# Patient Record
Sex: Female | Born: 1970 | Race: Black or African American | Hispanic: No | Smoking: Never smoker
Health system: Southern US, Community
[De-identification: ages and names within clinical notes are randomized; demographics above are authoritative.]

## PROBLEM LIST (undated history)

## (undated) DIAGNOSIS — I10 Essential (primary) hypertension: Secondary | ICD-10-CM

## (undated) DIAGNOSIS — F603 Borderline personality disorder: Secondary | ICD-10-CM

## (undated) DIAGNOSIS — F319 Bipolar disorder, unspecified: Secondary | ICD-10-CM

## (undated) DIAGNOSIS — F32A Depression, unspecified: Secondary | ICD-10-CM

## (undated) DIAGNOSIS — M1990 Unspecified osteoarthritis, unspecified site: Secondary | ICD-10-CM

## (undated) DIAGNOSIS — F4310 Post-traumatic stress disorder, unspecified: Secondary | ICD-10-CM

## (undated) HISTORY — DX: Bipolar disorder, unspecified: F31.9

## (undated) HISTORY — PX: TUBAL LIGATION: SHX77

## (undated) HISTORY — DX: Essential (primary) hypertension: I10

---

## 2010-09-18 ENCOUNTER — Emergency Department (HOSPITAL_COMMUNITY)
Admission: EM | Admit: 2010-09-18 | Discharge: 2010-09-18 | Disposition: A | Discharge status: Home / Self Care | Payer: Self-pay | Attending: Emergency Medicine | Admitting: Emergency Medicine

## 2010-09-18 DIAGNOSIS — Z91199 Patient's noncompliance with other medical treatment and regimen due to unspecified reason: Secondary | ICD-10-CM | POA: Insufficient documentation

## 2010-09-18 DIAGNOSIS — F319 Bipolar disorder, unspecified: Secondary | ICD-10-CM | POA: Insufficient documentation

## 2010-09-18 DIAGNOSIS — M25476 Effusion, unspecified foot: Secondary | ICD-10-CM | POA: Insufficient documentation

## 2010-09-18 DIAGNOSIS — I10 Essential (primary) hypertension: Secondary | ICD-10-CM | POA: Insufficient documentation

## 2010-09-18 DIAGNOSIS — Z79899 Other long term (current) drug therapy: Secondary | ICD-10-CM | POA: Insufficient documentation

## 2010-09-18 DIAGNOSIS — Z9119 Patient's noncompliance with other medical treatment and regimen: Secondary | ICD-10-CM | POA: Insufficient documentation

## 2010-09-18 DIAGNOSIS — M25473 Effusion, unspecified ankle: Secondary | ICD-10-CM | POA: Insufficient documentation

## 2010-09-18 DIAGNOSIS — H538 Other visual disturbances: Secondary | ICD-10-CM | POA: Insufficient documentation

## 2010-09-18 LAB — BASIC METABOLIC PANEL
Calcium: 9.4 mg/dL (ref 8.4–10.5)
Chloride: 103 mEq/L (ref 96–112)
Creatinine, Ser: 0.96 mg/dL (ref 0.50–1.10)
GFR calc Af Amer: >60 mL/min (ref >60)
GFR calc non Af Amer: >60 mL/min (ref >60)

## 2010-09-18 LAB — CBC
MCHC: 32.9 g/dL (ref 30.0–36.0)
MCV: 79.8 fL (ref 78.0–100.0)
Platelets: 383 10*3/uL (ref 150–400)
RDW: 15.2 % (ref 11.5–15.5)
WBC: 10.8 10*3/uL — ABNORMAL HIGH (ref 4.0–10.5)

## 2010-09-18 LAB — DIFFERENTIAL
Eosinophils Absolute: 0.1 10*3/uL (ref 0.0–0.7)
Eosinophils Relative: 1 % (ref 0–5)
Lymphs Abs: 3.2 10*3/uL (ref 0.7–4.0)
Monocytes Absolute: 0.6 10*3/uL (ref 0.1–1.0)

## 2010-09-23 ENCOUNTER — Emergency Department (HOSPITAL_COMMUNITY): Payer: Self-pay

## 2010-09-23 ENCOUNTER — Emergency Department (HOSPITAL_COMMUNITY)
Admission: EM | Admit: 2010-09-23 | Discharge: 2010-09-23 | Disposition: A | Discharge status: Home / Self Care | Payer: Self-pay | Attending: Emergency Medicine | Admitting: Emergency Medicine

## 2010-09-23 DIAGNOSIS — R110 Nausea: Secondary | ICD-10-CM | POA: Insufficient documentation

## 2010-09-23 DIAGNOSIS — Z79899 Other long term (current) drug therapy: Secondary | ICD-10-CM | POA: Insufficient documentation

## 2010-09-23 DIAGNOSIS — R079 Chest pain, unspecified: Secondary | ICD-10-CM | POA: Insufficient documentation

## 2010-09-23 DIAGNOSIS — R10811 Right upper quadrant abdominal tenderness: Secondary | ICD-10-CM | POA: Insufficient documentation

## 2010-09-23 DIAGNOSIS — R42 Dizziness and giddiness: Secondary | ICD-10-CM | POA: Insufficient documentation

## 2010-09-23 DIAGNOSIS — R109 Unspecified abdominal pain: Secondary | ICD-10-CM | POA: Insufficient documentation

## 2010-09-23 DIAGNOSIS — I10 Essential (primary) hypertension: Secondary | ICD-10-CM | POA: Insufficient documentation

## 2010-09-23 DIAGNOSIS — R609 Edema, unspecified: Secondary | ICD-10-CM | POA: Insufficient documentation

## 2010-09-23 DIAGNOSIS — F3130 Bipolar disorder, current episode depressed, mild or moderate severity, unspecified: Secondary | ICD-10-CM | POA: Insufficient documentation

## 2010-09-23 LAB — URINALYSIS, ROUTINE W REFLEX MICROSCOPIC
Glucose, UA: NEGATIVE mg/dL
Hgb urine dipstick: NEGATIVE
Protein, ur: NEGATIVE mg/dL
Specific Gravity, Urine: 1.014 (ref 1.005–1.030)
Urobilinogen, UA: 1 mg/dL (ref 0.0–1.0)

## 2010-09-23 LAB — COMPREHENSIVE METABOLIC PANEL
ALT: 13 U/L (ref 0–35)
AST: 13 U/L (ref 0–37)
Calcium: 9.3 mg/dL (ref 8.4–10.5)
Creatinine, Ser: 0.9 mg/dL (ref 0.50–1.10)
Sodium: 137 mEq/L (ref 135–145)
Total Protein: 7.5 g/dL (ref 6.0–8.3)

## 2010-09-23 LAB — DIFFERENTIAL
Basophils Absolute: 0.1 10*3/uL (ref 0.0–0.1)
Basophils Relative: 1 % (ref 0–1)
Eosinophils Absolute: 0.2 10*3/uL (ref 0.0–0.7)
Eosinophils Relative: 2 % (ref 0–5)
Monocytes Absolute: 0.6 10*3/uL (ref 0.1–1.0)
Monocytes Relative: 6 % (ref 3–12)
Neutro Abs: 6.3 10*3/uL (ref 1.7–7.7)

## 2010-09-23 LAB — CBC
MCH: 26.3 pg (ref 26.0–34.0)
MCHC: 32.6 g/dL (ref 30.0–36.0)
Platelets: 371 10*3/uL (ref 150–400)
RDW: 15.2 % (ref 11.5–15.5)

## 2010-09-23 LAB — D-DIMER, QUANTITATIVE: D-Dimer, Quant: 0.51 ug/mL-FEU — ABNORMAL HIGH (ref 0.00–0.48)

## 2010-09-23 LAB — POCT I-STAT TROPONIN I: Troponin i, poc: 0 ng/mL (ref 0.00–0.08)

## 2010-09-23 LAB — URINE MICROSCOPIC-ADD ON

## 2010-09-23 MED ORDER — IOHEXOL 300 MG/ML  SOLN
100.0000 mL | Freq: Once | INTRAMUSCULAR | Status: AC | PRN
  Administered 2010-09-23: 100 mL via INTRAVENOUS

## 2010-09-25 ENCOUNTER — Emergency Department (HOSPITAL_COMMUNITY)
Admission: EM | Admit: 2010-09-25 | Discharge: 2010-09-25 | Disposition: A | Discharge status: Home / Self Care | Payer: Self-pay | Attending: Emergency Medicine | Admitting: Emergency Medicine

## 2010-09-25 DIAGNOSIS — F319 Bipolar disorder, unspecified: Secondary | ICD-10-CM | POA: Insufficient documentation

## 2010-09-25 DIAGNOSIS — R110 Nausea: Secondary | ICD-10-CM | POA: Insufficient documentation

## 2010-09-25 DIAGNOSIS — R0789 Other chest pain: Secondary | ICD-10-CM | POA: Insufficient documentation

## 2010-09-25 DIAGNOSIS — I10 Essential (primary) hypertension: Secondary | ICD-10-CM | POA: Insufficient documentation

## 2010-09-25 LAB — POCT I-STAT, CHEM 8
Creatinine, Ser: 0.9 mg/dL (ref 0.50–1.10)
Glucose, Bld: 85 mg/dL (ref 70–99)
HCT: 37 % (ref 36.0–46.0)
Hemoglobin: 12.6 g/dL (ref 12.0–15.0)
Potassium: 4 mEq/L (ref 3.5–5.1)
Sodium: 140 mEq/L (ref 135–145)
TCO2: 28 mmol/L (ref 0–100)

## 2010-09-27 ENCOUNTER — Emergency Department (HOSPITAL_COMMUNITY): Payer: Self-pay

## 2010-09-27 ENCOUNTER — Emergency Department (HOSPITAL_COMMUNITY)
Admission: EM | Admit: 2010-09-27 | Discharge: 2010-09-27 | Disposition: A | Discharge status: Home / Self Care | Payer: Self-pay | Attending: Emergency Medicine | Admitting: Emergency Medicine

## 2010-09-27 DIAGNOSIS — F3130 Bipolar disorder, current episode depressed, mild or moderate severity, unspecified: Secondary | ICD-10-CM | POA: Insufficient documentation

## 2010-09-27 DIAGNOSIS — R61 Generalized hyperhidrosis: Secondary | ICD-10-CM | POA: Insufficient documentation

## 2010-09-27 DIAGNOSIS — R0609 Other forms of dyspnea: Secondary | ICD-10-CM | POA: Insufficient documentation

## 2010-09-27 DIAGNOSIS — R0602 Shortness of breath: Secondary | ICD-10-CM | POA: Insufficient documentation

## 2010-09-27 DIAGNOSIS — R071 Chest pain on breathing: Secondary | ICD-10-CM | POA: Insufficient documentation

## 2010-09-27 DIAGNOSIS — R110 Nausea: Secondary | ICD-10-CM | POA: Insufficient documentation

## 2010-09-27 DIAGNOSIS — Z79899 Other long term (current) drug therapy: Secondary | ICD-10-CM | POA: Insufficient documentation

## 2010-09-27 DIAGNOSIS — R0989 Other specified symptoms and signs involving the circulatory and respiratory systems: Secondary | ICD-10-CM | POA: Insufficient documentation

## 2010-09-27 LAB — COMPREHENSIVE METABOLIC PANEL
ALT: 13 U/L (ref 0–35)
BUN: 13 mg/dL (ref 6–23)
CO2: 29 mEq/L (ref 19–32)
Calcium: 9.5 mg/dL (ref 8.4–10.5)
Creatinine, Ser: 1.13 mg/dL — ABNORMAL HIGH (ref 0.50–1.10)
GFR calc Af Amer: >60 mL/min (ref >60)
GFR calc non Af Amer: 54 mL/min — ABNORMAL LOW (ref >60)
Glucose, Bld: 85 mg/dL (ref 70–99)
Total Protein: 7.9 g/dL (ref 6.0–8.3)

## 2010-09-27 LAB — RAPID URINE DRUG SCREEN, HOSP PERFORMED
Amphetamines: NOT DETECTED
Benzodiazepines: NOT DETECTED
Cocaine: NOT DETECTED
Opiates: NOT DETECTED
Tetrahydrocannabinol: NOT DETECTED

## 2010-09-27 LAB — POCT I-STAT, CHEM 8
BUN: 12 mg/dL (ref 6–23)
Calcium, Ion: 1.16 mmol/L (ref 1.12–1.32)
Chloride: 103 mEq/L (ref 96–112)
Creatinine, Ser: 1.1 mg/dL (ref 0.50–1.10)
Glucose, Bld: 83 mg/dL (ref 70–99)
HCT: 37 % (ref 36.0–46.0)
Hemoglobin: 12.6 g/dL (ref 12.0–15.0)
Potassium: 4 mEq/L (ref 3.5–5.1)
Sodium: 139 mEq/L (ref 135–145)
TCO2: 27 mmol/L (ref 0–100)

## 2010-09-27 LAB — URINALYSIS, ROUTINE W REFLEX MICROSCOPIC
Hgb urine dipstick: NEGATIVE
Nitrite: NEGATIVE
Protein, ur: NEGATIVE mg/dL
Specific Gravity, Urine: 1.018 (ref 1.005–1.030)
Urobilinogen, UA: 1 mg/dL (ref 0.0–1.0)

## 2010-09-27 LAB — CBC
HCT: 34.2 % — ABNORMAL LOW (ref 36.0–46.0)
Hemoglobin: 11.3 g/dL — ABNORMAL LOW (ref 12.0–15.0)
MCH: 26.7 pg (ref 26.0–34.0)
MCHC: 33 g/dL (ref 30.0–36.0)
MCV: 80.7 fL (ref 78.0–100.0)
RBC: 4.24 MIL/uL (ref 3.87–5.11)

## 2010-09-27 LAB — POCT PREGNANCY, URINE: Preg Test, Ur: NEGATIVE

## 2010-09-27 LAB — URINE MICROSCOPIC-ADD ON

## 2010-09-27 LAB — POCT I-STAT TROPONIN I: Troponin i, poc: 0 ng/mL (ref 0.00–0.08)

## 2010-10-02 ENCOUNTER — Encounter: Payer: Self-pay | Admitting: Internal Medicine

## 2010-10-02 ENCOUNTER — Ambulatory Visit (INDEPENDENT_AMBULATORY_CARE_PROVIDER_SITE_OTHER): Payer: Self-pay | Admitting: Internal Medicine

## 2010-10-02 DIAGNOSIS — R0602 Shortness of breath: Secondary | ICD-10-CM

## 2010-10-02 DIAGNOSIS — I493 Ventricular premature depolarization: Secondary | ICD-10-CM

## 2010-10-02 DIAGNOSIS — R079 Chest pain, unspecified: Secondary | ICD-10-CM

## 2010-10-02 DIAGNOSIS — I4949 Other premature depolarization: Secondary | ICD-10-CM

## 2010-10-02 DIAGNOSIS — I10 Essential (primary) hypertension: Secondary | ICD-10-CM

## 2010-10-03 NOTE — Progress Notes (Signed)
HPI Patient is a 40 year old year old who was recently seen in the emergency room for chest pain She says her chest pains started 09/23/2010.  She says it is a sharp, stabbing sensation that lasts about 5 min.  Breathing makes it worse. Rest helps. She has had this before.  IN 2010 while she was in [place] she had similar episodes.  She had a stress test there that was reportedly normal. She also notes SOB with waling.  Does note that her heart skips (isolated).  Occasional dizziness, not nec with skips.  No syncope.  No PND. No Known Allergies  Current Outpatient Prescriptions  Medication Sig Dispense Refill  . ARIPiprazole (ABILIFY) 15 MG tablet Take 15 mg by mouth daily.        Marland Kitchen aspirin 81 MG tablet Take 81 mg by mouth daily.        Marland Kitchen atenolol (TENORMIN) 25 MG tablet Take 25 mg by mouth daily.        . citalopram (CELEXA) 20 MG tablet Take 20 mg by mouth daily.        . diphenhydrAMINE (BENADRYL) 25 MG tablet Take  As directed at bedtime       . furosemide (LASIX) 20 MG tablet Take 20 mg by mouth 2 (two) times daily.          No past medical history on file.  No past surgical history on file.  No family history on file.  History   Social History  . Marital Status: Single    Spouse Name: N/A    Number of Children: N/A  . Years of Education: N/A   Occupational History  . Not on file.   Social History Main Topics  . Smoking status: Never Smoker   . Smokeless tobacco: Not on file  . Alcohol Use: Not on file  . Drug Use: Not on file  . Sexually Active: Not on file   Other Topics Concern  . Not on file   Social History Narrative  . No narrative on file    Review of Systems:  All systems reviewed.  They are negative to the above problem except as previously stated.  Vital Signs: BP 102/70  Pulse 70  Ht 5\' 2"  (1.575 m)  Wt 291 lb (131.997 kg)  BMI 53.22 kg/m2  Physical Exam  HEENT:  Normocephalic, atraumatic. EOMI, PERRLA.  Neck: JVP is normal. No thyromegaly.  No bruits.  Lungs: clear to auscultation. No rales no wheezes.  Heart: Regular rate and rhythm. Normal S1, S2. No S3.   No significant murmurs. PMI not displaced. Chest:  Tender to palpation at sternum.  Brings on pain she has experienced.  Abdomen:  Supple, nontender. Normal bowel sounds. No masses. No hepatomegaly.  Extremities:   Good distal pulses throughout. No lower extremity edema.  Musculoskeletal :moving all extremities.  Neuro:   alert and oriented x3.  CN II-XII grossly intact.  EKG:  NSR with ventricular bigeminy   Assessment and Plan:

## 2010-10-04 ENCOUNTER — Encounter: Payer: Self-pay | Admitting: Internal Medicine

## 2010-10-04 ENCOUNTER — Emergency Department (HOSPITAL_COMMUNITY)
Admission: EM | Admit: 2010-10-04 | Discharge: 2010-10-05 | Disposition: A | Discharge status: Home / Self Care | Payer: Self-pay | Attending: Emergency Medicine | Admitting: Emergency Medicine

## 2010-10-04 DIAGNOSIS — I10 Essential (primary) hypertension: Secondary | ICD-10-CM | POA: Insufficient documentation

## 2010-10-04 DIAGNOSIS — F3130 Bipolar disorder, current episode depressed, mild or moderate severity, unspecified: Secondary | ICD-10-CM | POA: Insufficient documentation

## 2010-10-04 DIAGNOSIS — R42 Dizziness and giddiness: Secondary | ICD-10-CM | POA: Insufficient documentation

## 2010-10-04 DIAGNOSIS — Z79899 Other long term (current) drug therapy: Secondary | ICD-10-CM | POA: Insufficient documentation

## 2010-10-04 DIAGNOSIS — R079 Chest pain, unspecified: Secondary | ICD-10-CM | POA: Insufficient documentation

## 2010-10-04 DIAGNOSIS — R0602 Shortness of breath: Secondary | ICD-10-CM | POA: Insufficient documentation

## 2010-10-04 DIAGNOSIS — I493 Ventricular premature depolarization: Secondary | ICD-10-CM | POA: Insufficient documentation

## 2010-10-04 DIAGNOSIS — R55 Syncope and collapse: Secondary | ICD-10-CM | POA: Insufficient documentation

## 2010-10-04 LAB — POCT I-STAT, CHEM 8
Creatinine, Ser: 1 mg/dL (ref 0.50–1.10)
Glucose, Bld: 96 mg/dL (ref 70–99)
Hemoglobin: 12.6 g/dL (ref 12.0–15.0)
TCO2: 29 mmol/L (ref 0–100)

## 2010-10-04 NOTE — Assessment & Plan Note (Signed)
Pain appears more musculoskeltal.  I would not recomm other testing for this.

## 2010-10-04 NOTE — Assessment & Plan Note (Signed)
I would recommend and echo as well as a holter monitor to evaluate. Currently the patient is submitting paperwork for medicaid. She will call once she hears more.  I recom call back in a couple weeks.

## 2010-10-04 NOTE — Assessment & Plan Note (Signed)
Volume status looks good.  I would recomm an echo to evaluate LV function.

## 2010-10-04 NOTE — Assessment & Plan Note (Signed)
Adequate control. 

## 2010-10-05 ENCOUNTER — Emergency Department (HOSPITAL_COMMUNITY): Payer: Self-pay

## 2010-10-05 ENCOUNTER — Emergency Department (HOSPITAL_COMMUNITY)
Admission: EM | Admit: 2010-10-05 | Discharge: 2010-10-05 | Disposition: A | Discharge status: Home / Self Care | Payer: Self-pay | Attending: Emergency Medicine | Admitting: Emergency Medicine

## 2010-10-05 DIAGNOSIS — K029 Dental caries, unspecified: Secondary | ICD-10-CM | POA: Insufficient documentation

## 2010-10-05 DIAGNOSIS — F3130 Bipolar disorder, current episode depressed, mild or moderate severity, unspecified: Secondary | ICD-10-CM | POA: Insufficient documentation

## 2010-10-05 DIAGNOSIS — R221 Localized swelling, mass and lump, neck: Secondary | ICD-10-CM | POA: Insufficient documentation

## 2010-10-05 DIAGNOSIS — I10 Essential (primary) hypertension: Secondary | ICD-10-CM | POA: Insufficient documentation

## 2010-10-05 DIAGNOSIS — R220 Localized swelling, mass and lump, head: Secondary | ICD-10-CM | POA: Insufficient documentation

## 2010-10-05 DIAGNOSIS — Z79899 Other long term (current) drug therapy: Secondary | ICD-10-CM | POA: Insufficient documentation

## 2010-10-05 DIAGNOSIS — K089 Disorder of teeth and supporting structures, unspecified: Secondary | ICD-10-CM | POA: Insufficient documentation

## 2010-10-05 LAB — DIFFERENTIAL
Basophils Absolute: 0 10*3/uL (ref 0.0–0.1)
Basophils Relative: 0 % (ref 0–1)
Eosinophils Relative: 1 % (ref 0–5)
Monocytes Absolute: 0.6 10*3/uL (ref 0.1–1.0)
Monocytes Relative: 5 % (ref 3–12)

## 2010-10-05 LAB — CBC
HCT: 34.4 % — ABNORMAL LOW (ref 36.0–46.0)
MCH: 26.4 pg (ref 26.0–34.0)
MCHC: 32.6 g/dL (ref 30.0–36.0)
RDW: 15.6 % — ABNORMAL HIGH (ref 11.5–15.5)

## 2020-07-07 ENCOUNTER — Encounter (HOSPITAL_COMMUNITY): Payer: Self-pay

## 2020-07-07 ENCOUNTER — Other Ambulatory Visit: Payer: Self-pay

## 2020-07-07 ENCOUNTER — Emergency Department (HOSPITAL_COMMUNITY): Payer: Medicare HMO

## 2020-07-07 ENCOUNTER — Emergency Department (HOSPITAL_COMMUNITY)
Admission: EM | Admit: 2020-07-07 | Discharge: 2020-07-07 | Disposition: A | Discharge status: Home / Self Care | Payer: Medicare HMO | Attending: Emergency Medicine | Admitting: Emergency Medicine

## 2020-07-07 DIAGNOSIS — W3400XA Accidental discharge from unspecified firearms or gun, initial encounter: Secondary | ICD-10-CM | POA: Insufficient documentation

## 2020-07-07 DIAGNOSIS — I10 Essential (primary) hypertension: Secondary | ICD-10-CM | POA: Insufficient documentation

## 2020-07-07 DIAGNOSIS — S81011A Laceration without foreign body, right knee, initial encounter: Secondary | ICD-10-CM | POA: Insufficient documentation

## 2020-07-07 DIAGNOSIS — S8991XA Unspecified injury of right lower leg, initial encounter: Secondary | ICD-10-CM | POA: Diagnosis present

## 2020-07-07 HISTORY — DX: Unspecified osteoarthritis, unspecified site: M19.90

## 2020-07-07 HISTORY — DX: Essential (primary) hypertension: I10

## 2020-07-07 HISTORY — DX: Post-traumatic stress disorder, unspecified: F43.10

## 2020-07-07 HISTORY — DX: Depression, unspecified: F32.A

## 2020-07-07 HISTORY — DX: Bipolar disorder, unspecified: F31.9

## 2020-07-07 HISTORY — DX: Borderline personality disorder: F60.3

## 2020-07-07 MED ORDER — ACETAMINOPHEN 325 MG PO TABS
650.0000 mg | ORAL_TABLET | Freq: Once | ORAL | Status: AC
  Administered 2020-07-07: 650 mg via ORAL
  Filled 2020-07-07: qty 2
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
## 2020-07-07 NOTE — Discharge Instructions (Addendum)
Your x-ray today did not show any signs of foreign body.  May continue to clean your wound with plenty of soap and water.  May also apply bacitracin to help with any pain.  We did not update your tetanus status on today's visit as you received this injection  in March.

## 2020-07-07 NOTE — ED Notes (Signed)
Pt reports taking tylenol PTA.
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
## 2020-08-12 ENCOUNTER — Ambulatory Visit: Payer: Medicare Other | Attending: Registered Nurse | Admitting: Physical Therapy

## 2020-09-13 ENCOUNTER — Other Ambulatory Visit: Payer: Self-pay

## 2020-09-13 ENCOUNTER — Ambulatory Visit: Payer: Medicare Other | Attending: Registered Nurse | Admitting: Physical Therapy

## 2020-09-13 DIAGNOSIS — M6281 Muscle weakness (generalized): Secondary | ICD-10-CM | POA: Insufficient documentation

## 2020-09-13 DIAGNOSIS — R293 Abnormal posture: Secondary | ICD-10-CM | POA: Insufficient documentation

## 2020-09-13 DIAGNOSIS — R2689 Other abnormalities of gait and mobility: Secondary | ICD-10-CM | POA: Insufficient documentation

## 2020-09-13 NOTE — Patient Instructions (Addendum)
Bladder Irritants  Certain foods and beverages can be irritating to the bladder.  Avoiding these irritants may decrease your symptoms of urinary urgency, frequency or bladder pain.  Even reducing your intake can help with your symptoms.  Not everyone is sensitive to all bladder irritants, so you may consider focusing on one irritant at a time, removing or reducing your intake of that irritant for 7-10 days to see if this change helps your symptoms.  Water intake is also very important.  Below is a list of bladder irritants.  Drinks: alcohol, carbonated beverages, caffeinated beverages such as coffee and tea, drinks with artificial sweeteners, citrus juices, apple juice, tomato juice  Foods: tomatoes and tomato based foods, spicy food, sugar and artificial sweeteners, vinegar, chocolate, raw onion, apples, citrus fruits, pineapple, cranberries, tomatoes, strawberries, plums, peaches, cantaloupe  Other: acidic urine (too concentrated) - see water intake info below  Substitutes you can try that are NOT irritating to the bladder: cooked onion, pears, papayas, sun-brewed decaf teas, watermelons, non-citrus herbal teas, apricots, kava and low-acid instant drinks (Postum).    WATER INTAKE: Remember to drink lots of water (aim for fluid intake of half your body weight with 2/3 of fluids being water).  You may be limiting fluids due to fear of leakage, but this can actually worsen urgency symptoms due to highly concentrated urine.  Water helps balance the pH of your urine so it doesn't become too acidic - acidic urine is a bladder irritant!   Urge Incontinence  Ideal urination frequency is every 2-4 wakeful hours, which equates to 5-8 times within a 24-hour period.   Urge incontinence is leakage that occurs when the bladder muscle contracts, creating a sudden need to go before getting to the bathroom.   Going too often when your bladder isn't actually full can disrupt the body's automatic signals to  store and hold urine longer, which will increase urgency/frequency.  In this case, the bladder "is running the show" and strategies can be learned to retrain this pattern.   One should be able to control the first urge to urinate, at around 150mL.  The bladder can hold up to a "grande latte," or 400mL. To help you gain control, practice the Urge Drill below when urgency strikes.  This drill will help retrain your bladder signals and allow you to store and hold urine longer.  The overall goal is to stretch out your time between voids to reach a more manageable voiding schedule.    Practice your "quick flicks" often throughout the day (each waking hour) even when you don't need feel the urge to go.  This will help strengthen your pelvic floor muscles, making them more effective in controlling leakage.  Urge Drill  When you feel an urge to go, follow these steps to regain control: Stop what you are doing and be still Take one deep breath, directing your air into your abdomen Think an affirming thought, such as "I've got this." Do 5 quick flicks of your pelvic floor Walk with control to the bathroom to void, or delay voiding   THE KNACK  The Knack is a strategy you may use to help to reduce or prevent leakage or passing of urine, gas or feces during an activity that causes downward force on the pelvic floor muscles.    Activities that can cause downward pressure on the pelvic floor muscles include coughing, sneezing, laughing, bending, lifting, and transitioning from different body positions such as from laying down to sitting   up and sitting to standing.  To perform The Knack, consciously squeeze and lift your pelvic floor muscles to perform a strong, well-timed pelvic muscle contraction BEFORE AND DURING these activities above.  As your contraction gets more coordinated and your muscles get stronger, you will become more effective in controlling your experience of incontinence or gas passing during  these activities.     [facility] [address], [address] [place],  [address] Phone # [phone] Fax [phone]  
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
## 2020-09-17 ENCOUNTER — Encounter: Payer: Self-pay | Admitting: Physical Therapy

## 2020-09-21 ENCOUNTER — Ambulatory Visit: Payer: Medicare Other | Admitting: Physical Therapy

## 2020-09-26 ENCOUNTER — Ambulatory Visit: Payer: Medicare Other | Admitting: Physical Therapy

## 2020-10-05 ENCOUNTER — Encounter: Payer: Medicare Other | Admitting: Physical Therapy

## 2020-10-12 ENCOUNTER — Encounter: Payer: Medicare Other | Admitting: Physical Therapy

## 2020-10-19 ENCOUNTER — Encounter: Payer: Medicare Other | Admitting: Physical Therapy

## 2020-10-26 ENCOUNTER — Encounter: Payer: Medicare Other | Admitting: Physical Therapy

## 2020-11-02 ENCOUNTER — Encounter: Payer: Medicare Other | Admitting: Physical Therapy

## 2020-11-09 ENCOUNTER — Encounter: Payer: Medicare Other | Admitting: Physical Therapy

## 2020-11-16 ENCOUNTER — Encounter: Payer: Medicare Other | Admitting: Physical Therapy

## 2020-11-23 ENCOUNTER — Encounter: Payer: Medicare Other | Admitting: Physical Therapy
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
